# Patient Record
Sex: Female | Born: 1950 | Race: White | Hispanic: No | Smoking: Never smoker
Health system: Southern US, Community
[De-identification: ages and names within clinical notes are randomized; demographics above are authoritative.]

---

## 1998-02-18 ENCOUNTER — Other Ambulatory Visit: Admission: RE | Admit: 1998-02-18 | Discharge: 1998-02-18 | Payer: Self-pay | Admitting: Obstetrics and Gynecology

## 1998-04-29 ENCOUNTER — Inpatient Hospital Stay (HOSPITAL_COMMUNITY): Admission: RE | Admit: 1998-04-29 | Discharge: 1998-05-01 | Payer: Self-pay | Admitting: Obstetrics and Gynecology

## 1999-11-05 ENCOUNTER — Other Ambulatory Visit: Admission: RE | Admit: 1999-11-05 | Discharge: 1999-11-05 | Payer: Self-pay | Admitting: Obstetrics and Gynecology

## 2000-11-10 ENCOUNTER — Other Ambulatory Visit: Admission: RE | Admit: 2000-11-10 | Discharge: 2000-11-10 | Payer: Self-pay | Admitting: Obstetrics and Gynecology

## 2001-11-20 ENCOUNTER — Other Ambulatory Visit: Admission: RE | Admit: 2001-11-20 | Discharge: 2001-11-20 | Payer: Self-pay | Admitting: Obstetrics and Gynecology

## 2002-11-21 ENCOUNTER — Other Ambulatory Visit: Admission: RE | Admit: 2002-11-21 | Discharge: 2002-11-21 | Payer: Self-pay | Admitting: Obstetrics and Gynecology

## 2010-05-27 ENCOUNTER — Ambulatory Visit (HOSPITAL_BASED_OUTPATIENT_CLINIC_OR_DEPARTMENT_OTHER): Admission: RE | Admit: 2010-05-27 | Discharge: 2010-05-27 | Payer: Self-pay | Admitting: Orthopedic Surgery

## 2010-12-13 ENCOUNTER — Encounter: Payer: Self-pay | Admitting: Orthopedic Surgery

## 2011-02-07 LAB — POCT I-STAT 4, (NA,K, GLUC, HGB,HCT)
Glucose, Bld: 100 mg/dL — ABNORMAL HIGH (ref 70–99)
Sodium: 141 mEq/L (ref 135–145)

## 2012-01-26 ENCOUNTER — Other Ambulatory Visit: Payer: Self-pay | Admitting: Orthopedic Surgery

## 2012-01-26 DIAGNOSIS — M5126 Other intervertebral disc displacement, lumbar region: Secondary | ICD-10-CM

## 2012-02-04 ENCOUNTER — Ambulatory Visit
Admission: RE | Admit: 2012-02-04 | Discharge: 2012-02-04 | Disposition: A | Discharge status: Home / Self Care | Payer: BC Managed Care – PPO | Source: Ambulatory Visit | Attending: Orthopedic Surgery | Admitting: Orthopedic Surgery

## 2012-02-04 DIAGNOSIS — M5126 Other intervertebral disc displacement, lumbar region: Secondary | ICD-10-CM

## 2012-02-04 MED ORDER — HYDROCODONE-ACETAMINOPHEN 5-325 MG PO TABS
1.0000 | ORAL_TABLET | Freq: Once | ORAL | Status: AC
  Administered 2012-02-04: 1 via ORAL

## 2012-02-04 MED ORDER — IOHEXOL 180 MG/ML  SOLN
15.0000 mL | Freq: Once | INTRAMUSCULAR | Status: AC | PRN
  Administered 2012-02-04: 15 mL via INTRATHECAL

## 2012-02-04 NOTE — Discharge Instructions (Signed)
Myelogram Discharge Instructions  1. Go home and rest quietly for the next 24 hours.  It is important to lie flat for the next 24 hours.  Get up only to go to the restroom.  You may lie in the bed or on a couch on your back, your stomach, your left side or your right side.  You may have one pillow under your head.  You may have pillows between your knees while you are on your side or under your knees while you are on your back.  2. DO NOT drive today.  Recline the seat as far back as it will go, while still wearing your seat belt, on the way home.  3. You may get up to go to the bathroom as needed.  You may sit up for 10 minutes to eat.  You may resume your normal diet and medications unless otherwise indicated.  Drink lots of extra fluids today and tomorrow.  4. The incidence of headache, nausea, or vomiting is about 5% (one in 20 patients).  If you develop a headache, lie flat and drink plenty of fluids until the headache goes away.  Caffeinated beverages may be helpful.  If you develop severe nausea and vomiting or a headache that does not go away with flat bed rest, call [phone].  5. You may resume normal activities after your 24 hours of bed rest is over; however, do not exert yourself strongly or do any heavy lifting tomorrow.  6. Call your physician for a follow-up appointment.  The results of your myelogram will be sent directly to your physician by the following day.  7. If you have any questions or if complications develop after you arrive home, please call [phone].  Discharge instructions have been explained to the patient.  The patient, or the person responsible for the patient, fully understands these instructions.       May resume citalopram on February 05, 2012, after 9:30 am.

## 2013-06-28 IMAGING — CT CT L SPINE W/ CM
3 of 10 series · 9 of 27 positions shown, 10 images · IV contrast (omnipaque)
Comparison: MRI of the lumbar spine at [REDACTED] 12/01/2011.

CLINICAL DATA: Low back pain extending into the right lower
extremity.  Lumbar disc displacement without myelopathy.  722.10.

MYELOGRAM INJECTION
TECHNIQUE: Informed consent was obtained from the patient prior to
the procedure, including potential complications of headache,
allergy, infection and pain.  A timeout procedure was performed.
With the patient prone, the lower back was prepped with Betadine.
1% Lidocaine was used for local anesthesia.  Lumbar puncture was
performed at the left paramidline L2-3 level using a 22 gauge
needle with return of clear CSF.  15 ml of Omnipaque 433was
injected into the subarachnoid space .
TECHNIQUE: Following injection of intrathecal Omnipaque contrast,
spine imaging in multiple projections was performed using
fluoroscopy.
Fluoroscopy Time: 1.19 minutes.
TECHNIQUE: CT imaging of the lumbar spine was performed after
intrathecal contrast administration.  Multiplanar CT image
reconstructions were also generated.

[Series 2: l spine bone · axial · 0.27mm/px · z∈[+8,+86]mm · 2 of 94 slices shown, 3 images]
[im 32/94  soft-tissue]
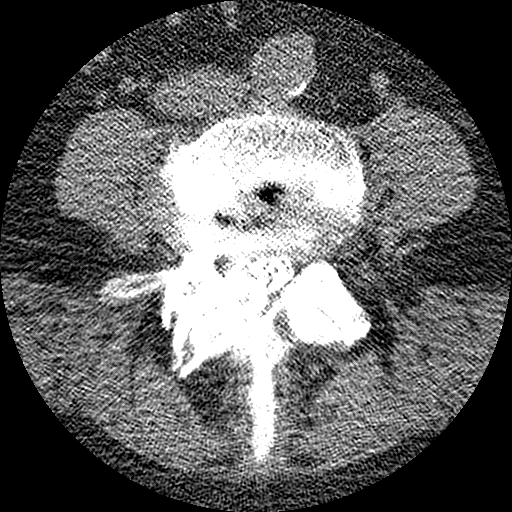
[im 32/94  bone]
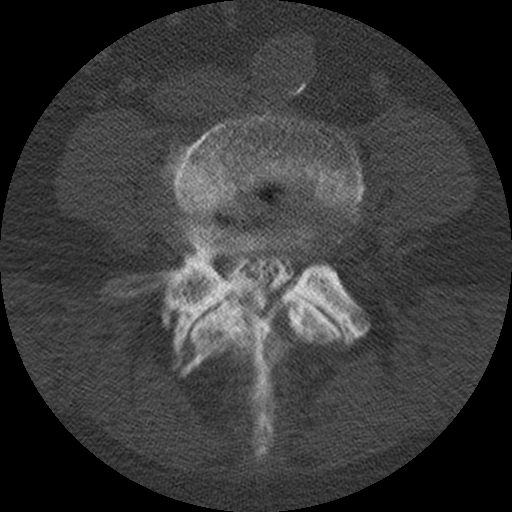
[im 63/94  bone]
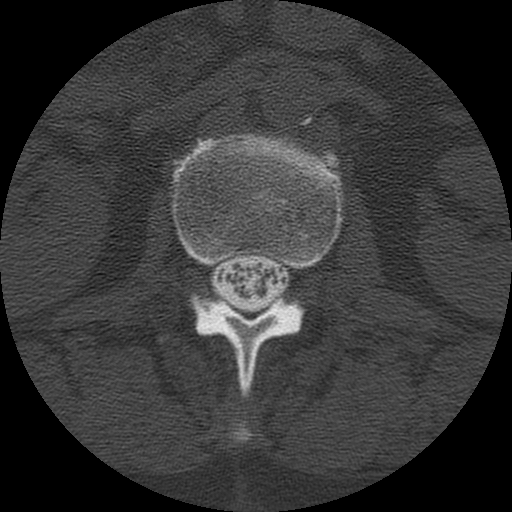

[Series 3: l spine soft · axial · 0.27mm/px · z∈[+8,+86]mm · 2 of 94 slices shown]
[im 32/94  soft-tissue]
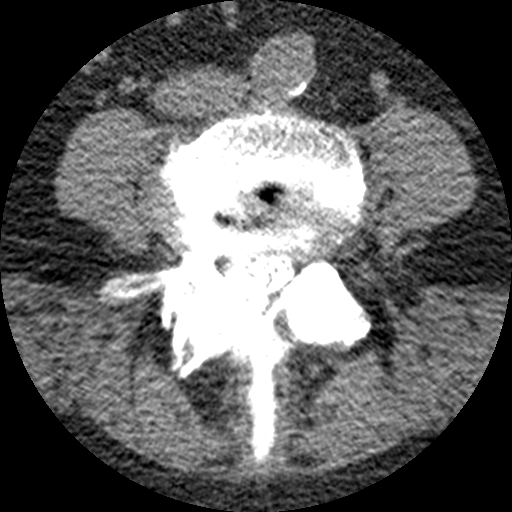
[im 63/94  soft-tissue]
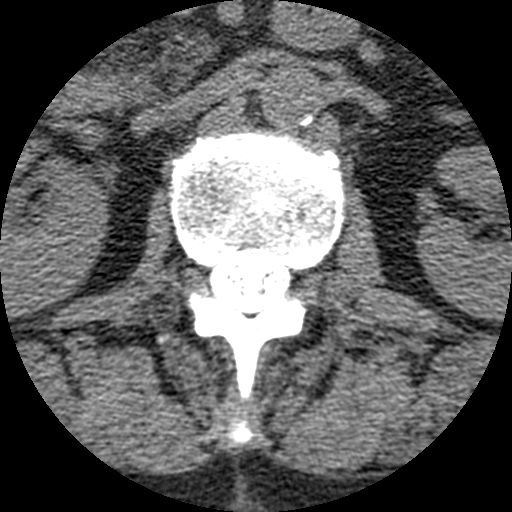

[Series 400: cor · coronal · 0.47mm/px · 5 of 55 slices shown]
[im 10/55  bone]
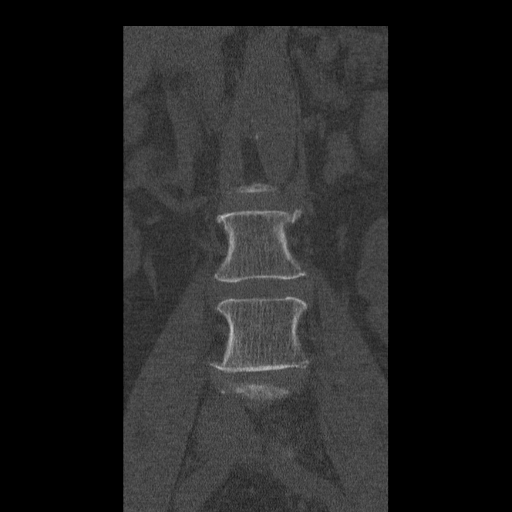
[im 19/55  bone]
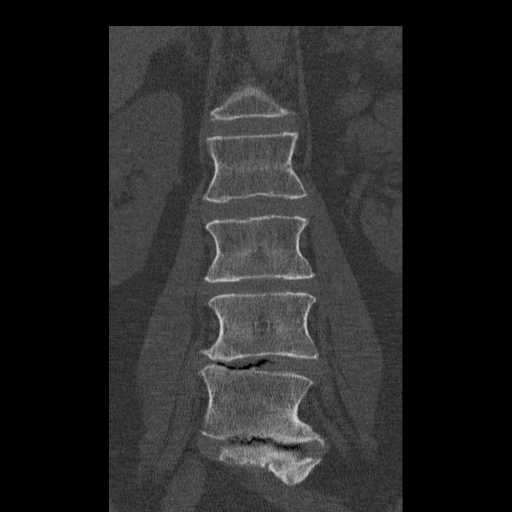
[im 28/55  bone]
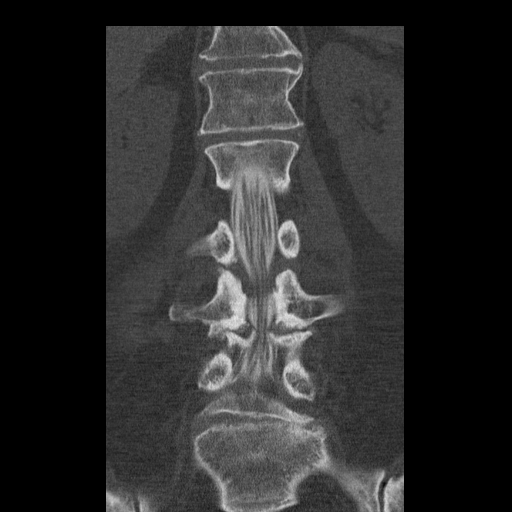
[im 37/55  bone]
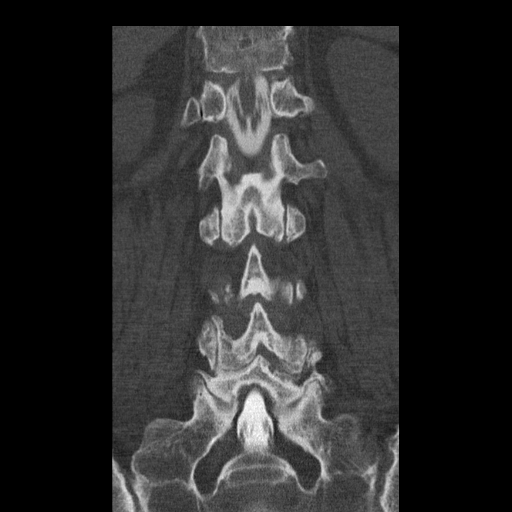
[im 46/55  bone]
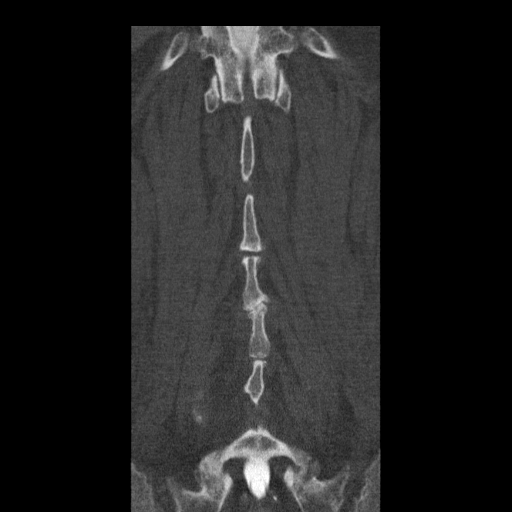

[9 of 27 positions shown; findings below may reference images not displayed]

IMPRESSION: Successful injection of  intrathecal contrast for myelography.

MYELOGRAM LUMBAR
FINDINGS: A transitional S1 segment is evident.  The L1 segment
demonstrates a small left-sided rib.  Further correlation for
numbering could be done with a chest radiograph.  For the purposes
of this exam, the last fully formed vertebral body will be labeled
S1.

The upper nerve roots fill normally.  A waist deformity is present
at L3-4 in association with a prominent broad-based disc
herniation.  There is impact on the lateral recess bilaterally,
right greater than left.

There is slight lateral listhesis at L4-5 to the left.  Right
greater than left lateral recess narrowing is secondary to
uncovering of the disc and a rightward disc protrusion.

Disc herniation and lateral recess narrowing is worse on the left
at L5-S1.

The upright images demonstrate slight exaggeration of the L3-4 disc
herniation.  The alignment, including slight anterolisthesis at L4-
5 is stable in the neutral upright position.  There is slight
exaggeration of the anterolisthesis at L4-5 with flexion.  This is
reduced in extension.
IMPRESSION: 1.  Mild dynamic anterolisthesis at L4-5.
2.  Broad-based disc herniation at L3-4 with mild lateral recess
narrowing bilaterally.  The disc herniation is exaggerated upon
standing.
3.  Right greater than left lateral recess narrowing at L4-5.
4.  Mild left lateral recess narrowing at L5-S1.
5.  Transitional anatomy at S1.


CT MYELOGRAPHY LUMBAR SPINE
FINDINGS: Accounting for transitional anatomy at S1, the conus
medullaris terminates at L1-2, within normal limits.  The
anterolisthesis at L4-5 measures 3 mm.  Alignment is otherwise
anatomic.  Vacuum phenomena are evident in the disc spaces at L3-4,
L4-5, and L5-S1.  The

Minimal atherosclerotic calcifications are present in the aorta.
There is no aneurysm.  The the individual disc levels are as
follows.

T12-L1:  A shallow rightward disc protrusion is present.  This
extends into the inferior recess of the right neural foramen
without significant stenosis.

L1-2:  No significant disc herniation or stenosis is present.

L2-3:  No significant disc herniation or stenosis is present.
There is mild facet disease.

L3-4:  A broad-based disc herniation is present.  Mild lateral
recess narrowing is evident bilaterally.  Facet hypertrophy and
ligamentum flavum thickening contribute.  Mild foraminal narrowing
is similar to the prior exam bilaterally.

L4-5:  Slight anterolisthesis results in uncovering of the disc.  A
broad-based disc herniation is present.  Advanced facet hypertrophy
is worse on the right.  Mild lateral recess narrowing is worse on
the right.  There is focal levo convex curvature of lumbar spine at
L4-5. Moderate foraminal stenosis bilaterally is secondary to
exposed disc but primarily due to facet hypertrophy.

L5-S1:  A broad-based disc herniation is present.  Advanced facet
hypertrophy is evident bilaterally.  Mild left lateral recess
narrowing is present.  Moderate to severe left and moderate right
foraminal stenosis is secondary to facet disease.
IMPRESSION: 1.  Broad-based disc herniation and mild facet hypertrophy at L3-4
results in mild lateral recess narrowing bilaterally.  Mild
foraminal narrowing is stable.
2.  Advanced facet disease and anterolisthesis at L4-5.
3.  Mild right lateral recess narrowing at L4-5.
4.  Moderate to severe foraminal stenosis bilaterally at L4-5 is
worse on the right.  This is predominately due to facet disease.
5.  Mild left lateral recess narrowing at L5-S1.
6.  Moderate to severe left and moderate right foraminal stenosis
at L5-S1.  This is again due to facet disease.

## 2018-09-21 ENCOUNTER — Ambulatory Visit (INDEPENDENT_AMBULATORY_CARE_PROVIDER_SITE_OTHER): Payer: BC Managed Care – PPO | Admitting: Rehabilitative and Restorative Service Providers"

## 2018-09-21 ENCOUNTER — Encounter: Payer: Self-pay | Admitting: Rehabilitative and Restorative Service Providers"

## 2018-09-21 DIAGNOSIS — R293 Abnormal posture: Secondary | ICD-10-CM

## 2018-09-21 DIAGNOSIS — R29898 Other symptoms and signs involving the musculoskeletal system: Secondary | ICD-10-CM | POA: Diagnosis not present

## 2018-09-21 DIAGNOSIS — M545 Low back pain: Secondary | ICD-10-CM | POA: Diagnosis not present

## 2018-09-21 DIAGNOSIS — G8929 Other chronic pain: Secondary | ICD-10-CM

## 2018-09-21 DIAGNOSIS — M542 Cervicalgia: Secondary | ICD-10-CM

## 2018-09-21 NOTE — Patient Instructions (Signed)
Axial Extension (Chin Tuck)    Pull chin in and lengthen back of neck. Hold __5__ seconds while counting out loud. Repeat __10__ times. Do __several__ sessions per day.  Shoulder Blade Squeeze    Rotate shoulders back, then squeeze shoulder blades down and back Hold 10 sec Repeat __10__ times. Do _several__ sessions per day.  Upper Back Strength: Lower Trapezius / Rotator Cuff " L's "     Arms in waitress pose, palms up. Press hands back and slide shoulder blades down. Hold for __5__ seconds. Repeat _10___ times. 1-2 times per day.    Scapular Retraction: Elbow Flexion (Standing)  "W's"     With elbows bent to 90, pinch shoulder blades together and rotate arms out, keeping elbows bent. Repeat __10__ times per set. Do __1-2__ sets per session. Do _several ___ sessions per day.  Self massage using a 4 inch plastic ball    TENS UNIT: This is helpful for muscle pain and spasm.   Search and Purchase a TENS 7000 2nd edition at www.tenspros.com. It should be less than $30.     TENS unit instructions: Do not shower or bathe with the unit on Turn the unit off before removing electrodes or batteries If the electrodes lose stickiness add a drop of water to the electrodes after they are disconnected from the unit and place on plastic sheet. If you continued to have difficulty, call the TENS unit company to purchase more electrodes. Do not apply lotion on the skin area prior to use. Make sure the skin is clean and dry as this will help prolong the life of the electrodes. After use, always check skin for unusual red areas, rash or other skin difficulties. If there are any skin problems, does not apply electrodes to the same area. Never remove the electrodes from the unit by pulling the wires. Do not use the TENS unit or electrodes other than as directed. Do not change electrode placement without consultating your therapist or physician. Keep 2 fingers with between each  electrode.     [facility] [address] [place], [place] [address]  [phone] (office) [phone] (fax)

## 2018-09-21 NOTE — Therapy (Signed)
[facility] [place] [address] [place], [place], [address] Phone: [phone]   Fax:  [phone]  Physical Therapy Evaluation  Patient Details  Name: Chelsea Macdonald MRN: [id number] Date of Birth: 12-05-1950 Referring Provider (PT): Dr Sheran Luz   Encounter Date: 09/21/2018  PT End of Session - 09/21/18 1653    Visit Number  1    Number of Visits  12    Date for PT Re-Evaluation  11/02/18    PT Start Time  1653    PT Stop Time  1751    PT Time Calculation (min)  58 min    Activity Tolerance  Patient tolerated treatment well       History reviewed. No pertinent past medical history.  History reviewed. No pertinent surgical history.  There were no vitals filed for this visit.   Subjective Assessment - 09/21/18 1702    Subjective  Patient reports that she was involved in [facility] 1/18 with injury to neck at that time. She was taking medication for LBP and did not notice so much pain in the neck. She has noticed increasing pain in the neck in the past year.  she now has pain in the neck all the time. She has some temporary relief with medications but pain in present on a daily basis. She has had LBP for several years. She was seen for LBP 11/2009. She was diagnosed with disc problems L3/4/5. She was working in the water with improvement but has not been able to exercise in the water in over a year.     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  Yes    Pain Score  6     Pain Location  Neck    Pain Orientation  Posterior;Lower;Mid;Right;Left    Pain Descriptors / Indicators  Nagging    Pain Type  Chronic pain     Pain Radiating Towards  once into the shoulder area normally in the neck only     Pain Onset  More than a month ago    Pain Frequency  Constant    Aggravating Factors   when tired; not sure what irritates symptoms     Pain Relieving Factors  meds; ice; pain patches     Multiple Pain Sites  Yes    Pain Score  6    Pain Location  Back    Pain Orientation  Lower;Right;Left;Mid    Pain Descriptors / Indicators  Aching    Pain Type  Chronic pain    Pain Radiating Towards  occasionally pain down the side of the Rt leg     Pain Onset  More than a month ago    Pain Frequency  Constant    Aggravating Factors   standing; driving     Pain Relieving Factors  ice; water exercise; medication          [facility] PT Assessment - 09/21/18 0001      Assessment   Medical Diagnosis  Cervicalgia; LBP     Referring Provider (PT)  Dr Sheran Luz  Onset Date/Surgical Date  11/22/16   LBP present ~ 8-9 yrs    Hand Dominance  Right    Next MD Visit  12/19    Prior Therapy  some PT in past LBP not tolerated well - PT for knee       Precautions   Precautions  None      Balance Screen   Has the patient fallen in the past 6 months  Yes    How many times?  1    Has the patient had a decrease in activity level because of a fear of falling?   No    Is the patient reluctant to leave their home because of a fear of falling?   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       Observation/Other Assessments   Focus on Therapeutic Outcomes (FOTO)   54% limitation       Sensation   Additional Comments  WFL's per pt report        Posture/Postural Control   Posture Comments  head forward; shoulders rounded and elevated; scapulae abducted and rotated along the thoracic wall       AROM   Cervical Flexion  52    Cervical Extension  40    Cervical - Right Side Bend  30    Cervical - Left Side Bend  30    Cervical - Right Rotation  54    Cervical - Left Rotation  55      Palpation   Spinal mobility  hypomobility through the lower cervical and upper thoracic spine with CPA and lateral mobs     Palpation comment  muscular tightnes noted through the ant/lat/post cervical musculature; upper trap; leveator; leveator; pecs                 Objective measurements completed on examination: See above findings.      [facility] Adult PT Treatment/Exercise - 09/21/18 0001      Neuro Re-ed    Neuro Re-ed Details   initiated work on posture and alignment working on chest lift and engaging posterior shoulder girdle       Shoulder Exercises: Standing   Other Standing Exercises  axial extensin 10 sec x 5; scap squeeze 10 sec x 5; L's x 5; W's x 5 with swim noodle       Moist Heat Therapy   Number Minutes Moist Heat  12 Minutes    Moist Heat Location  Cervical;Lumbar Spine      Electrical Stimulation   Electrical Stimulation Location  bilat lower cervical/upper thoracic spine     Electrical Stimulation Action  IFC    Electrical Stimulation Parameters  to tolerance     Electrical Stimulation Goals  Pain;Tone             PT Education - 09/21/18 1738    Education Details  HEP TENS     Person(s) Educated  Patient    Methods  Explanation;Demonstration;Tactile cues;Verbal  cues;Handout    Comprehension  Verbalized understanding;Returned demonstration;Verbal cues required;Tactile cues required       PT Short Term Goals - 09/21/18 1750      PT SHORT TERM GOAL #1   Title  complete assessment for lumbar spine 10/06/18    Time  1    Period  Weeks    Status  New        PT Long Term Goals - 09/21/18 1751       PT LONG TERM GOAL #1   Title  Improve posture and alignment with patient to demonstrate improved posture and alignment with posterior shoulder girdle engaged 11/02/18    Time  6    Period  Weeks    Status  New      PT LONG TERM GOAL #2   Title  increase cervical ROM/mobility with minimal to no pain 11/02/18    Time  6    Period  Weeks    Status  New      PT LONG TERM GOAL #3   Title  decrease cervical and LBP with patient to report 2/10 to 3/10 pain for functional activity level     Time  6    Period  Weeks    Status  New      PT LONG TERM GOAL #4   Title  independent in HEP 11/02/18    Time  6    Period  Weeks    Status  New      PT LONG TERM GOAL #5   Title  improve FOTO to </= 47% limitation 11/02/18    Time  6    Period  Weeks    Status  New             Plan - 09/21/18 1745    Clinical Impression Statement  Patient presents with c/o chronic cervical and lumbar pain with poor posture and alignment; limited mobilty/ROM; postural weakness; muscular tightness to palpation; pai nlimiting functional activitiy level. Patient will benefit from PT to address problems identified.     History and Personal Factors relevant to plan of care:  chronic cervical and LBP; forward posture with work and home activities; sedentary lifestyle; recliner    Clinical Presentation  Stable    Clinical Decision Making  Low    Rehab Potential  Good    PT Frequency  2x / week    PT Duration  6 weeks    PT Treatment/Interventions  Patient/family education;ADLs/Self Care Home Management;Cryotherapy;Electrical Stimulation;Iontophoresis 4mg /ml Dexamethasone;Moist Heat;Ultrasound;Dry needling;Manual techniques;Neuromuscular re-education;Therapeutic activities;Therapeutic exercise    PT Next Visit Plan  review HEP; evaluation of LBP; postural correction; stretching; postural strengthening; modalities as indicated     Consulted and Agree with Plan of Care  Patient       Patient will benefit from  skilled therapeutic intervention in order to improve the following deficits and impairments:  Postural dysfunction, Improper body mechanics, Pain, Increased fascial restricitons, Increased muscle spasms, Hypomobility, Decreased mobility, Decreased range of motion, Decreased strength, Decreased activity tolerance  Visit Diagnosis: Cervicalgia - Plan: PT plan of care cert/re-cert  Chronic bilateral low back pain without sciatica - Plan: PT plan of care cert/re-cert  Abnormal posture - Plan: PT plan of care cert/re-cert  Other symptoms and signs involving the musculoskeletal system - Plan: PT plan of care cert/re-cert     Problem List There are no active problems to display for this patient.   Ithzel Fedorchak Rober Minion PT, MPH  09/21/2018, 6:05  PM  [facility] [address] [place], [place], [address] Phone: [phone]   Fax:  [phone]  Name: Chelsea Macdonald MRN: [id number] Date of Birth: 15-Nov-1951

## 2018-09-27 ENCOUNTER — Ambulatory Visit: Payer: BC Managed Care – PPO | Admitting: Rehabilitative and Restorative Service Providers"

## 2018-09-27 ENCOUNTER — Encounter: Payer: Self-pay | Admitting: Rehabilitative and Restorative Service Providers"

## 2018-09-27 DIAGNOSIS — R29898 Other symptoms and signs involving the musculoskeletal system: Secondary | ICD-10-CM | POA: Diagnosis not present

## 2018-09-27 DIAGNOSIS — M542 Cervicalgia: Secondary | ICD-10-CM | POA: Diagnosis not present

## 2018-09-27 DIAGNOSIS — M545 Low back pain: Secondary | ICD-10-CM

## 2018-09-27 DIAGNOSIS — G8929 Other chronic pain: Secondary | ICD-10-CM

## 2018-09-27 DIAGNOSIS — R293 Abnormal posture: Secondary | ICD-10-CM | POA: Diagnosis not present

## 2018-09-27 NOTE — Therapy (Signed)
[facility] [place] [address] [place], [place], [address] Phone: [phone]   Fax:  [phone]  Physical Therapy Treatment  Patient Details  Name: Chelsea Macdonald MRN: [id number] Date of Birth: 1951-07-07 Referring Provider (PT): Dr Sheran Luz   Encounter Date: 09/27/2018  PT End of Session - 09/27/18 1604    Visit Number  2    Number of Visits  12    Date for PT Re-Evaluation  11/02/18    PT Start Time  1603    PT Stop Time  1659    PT Time Calculation (min)  56 min    Activity Tolerance  Patient tolerated treatment well       History reviewed. No pertinent past medical history.  History reviewed. No pertinent surgical history.  There were no vitals filed for this visit.  Subjective Assessment - 09/27/18 1608    Subjective  Patient reports that she had some relief with the initial treatment in the neck. She has done her exercises at home.     Currently in Pain?  Yes    Pain Score  4     Pain Location  Neck    Pain Orientation  Lower;Mid;Right;Left    Pain Descriptors / Indicators  Nagging    Pain Type  Chronic pain    Pain Onset  More than a month ago    Pain Frequency  Intermittent    Pain Score  6    Pain Location  Back    Pain Orientation  Lower;Right;Left;Mid    Pain Descriptors / Indicators  Aching    Pain Type  Chronic pain                       [facility] Adult PT Treatment/Exercise - 09/27/18 0001      Shoulder Exercises: Standing   Other Standing Exercises  axial extensin 10 sec x 5; scap squeeze 10 sec x 5; L's x 5; W's x 5 with swim noodle       Shoulder Exercises: ROM/Strengthening   Nustep  L5 x 6 min UE at 10       Shoulder Exercises: Stretch   Other Shoulder Stretches  3 way doorway stretch 30 sec x 2 each position as tolerated with Rt shoulder pathology    3 positions lower w/highest position ~ 90 deg abd      Moist Heat Therapy   Number Minutes Moist Heat  20 Minutes     Moist Heat Location  Cervical;Lumbar Spine      Electrical Stimulation   Electrical Stimulation Location  bilat lower cervical/upper thoracic spine     Electrical Stimulation Action  IFC    Electrical Stimulation Parameters  to tolerance    Electrical Stimulation Goals  Pain;Tone      Manual Therapy   Manual therapy comments  pt sitting    Joint Mobilization  lateral thoracic mobs Grade !!     Soft tissue mobilization  deep tissue work through the cervical and upper thoracic musculature - ant/lat/posterior     Passive ROM  assisted with axial extension in sitting - no pain - 10 sec x 3 reps; gentle lateral flexion from axial extension position 5 sec hold x 3 reps Rt/Lt              PT Education - 09/27/18 1651    Education Details  HEP     Person(s) Educated  Patient    Methods  Explanation;Demonstration;Tactile cues;Verbal cues;Handout    Comprehension  Verbalized understanding;Returned demonstration;Verbal cues required;Tactile cues required       PT Short Term Goals - 09/21/18 1750      PT SHORT TERM GOAL #1   Title  complete assessment for lumbar spine 10/06/18    Time  1    Period  Weeks    Status  New        PT Long Term Goals - 09/21/18 1751      PT LONG TERM GOAL #1   Title  Improve posture and alignment with patient to demonstrate improved posture and alignment with posterior shoulder girdle engaged 11/02/18    Time  6    Period  Weeks    Status  New      PT LONG TERM GOAL #2   Title  increase cervical ROM/mobility with minimal to no pain 11/02/18    Time  6    Period  Weeks    Status  New      PT LONG TERM GOAL #3   Title  decrease cervical and LBP with patient to report 2/10 to 3/10 pain for functional activity level     Time  6    Period  Weeks    Status  New      PT LONG TERM GOAL #4   Title  independent in HEP 11/02/18    Time  6    Period  Weeks    Status  New      PT LONG TERM GOAL #5   Title  improve FOTO to </= 47% limitation  11/02/18    Time  6    Period  Weeks    Status  New            Plan - 09/27/18 1604    Clinical Impression Statement  Patient reports that she has had continued pain in the cervical area. She has been working on her HEP. Patient has forward head posture with fulcrum of forward movement in the lower cervical/upper thoracic spine in the area of pt's tightness and pain. She added exercise with caution for pec stretch due to shoulder problems. She tolerated manual work well and demonstrated improved cervical mobility following manual work.     Rehab Potential  Good    PT Frequency  2x / week    PT Duration  6 weeks    PT Treatment/Interventions  Patient/family education;ADLs/Self Care Home Management;Cryotherapy;Electrical Stimulation;Iontophoresis 4mg /ml Dexamethasone;Moist Heat;Ultrasound;Dry needling;Manual techniques;Neuromuscular re-education;Therapeutic activities;Therapeutic exercise    PT Next Visit Plan  review HEP; evaluation of LBP; postural correction; stretching; postural strengthening; modalities as indicated     Consulted and Agree with Plan of Care  Patient       Patient will benefit from skilled therapeutic intervention in order to improve the following deficits and impairments:  Postural dysfunction, Improper body mechanics, Pain, Increased fascial restricitons, Increased muscle spasms, Hypomobility, Decreased mobility, Decreased range of motion, Decreased strength, Decreased activity tolerance  Visit Diagnosis: Cervicalgia  Chronic bilateral low back pain without sciatica  Abnormal posture  Other symptoms and signs involving the musculoskeletal system     Problem List There are no active problems to display for this patient.   Celyn Rober Minion PT, MPH  09/27/2018, 4:54 PM  [facility] [address] [place], [place], [address] Phone: [phone]   Fax:  [phone]  Name: Chelsea Macdonald MRN:  [id number] Date of Birth: 1950-12-07

## 2018-09-27 NOTE — Patient Instructions (Addendum)
Scapula Adduction With Pectoralis Stretch: Low - Standing   Shoulders at 45 hands even with shoulders, keeping weight through legs, shift weight forward until you feel pull or stretch through the front of your chest. Hold _30__ seconds. Do _3__ times, _2-4__ times per day.   Scapula Adduction With Pectoralis Stretch: Mid-Range - Standing   Shoulders at 90 elbows even with shoulders, keeping weight through legs, shift weight forward until you feel pull or strength through the front of your chest. Hold __30_ seconds. Do _3__ times, __2-4_ times per day.   Scapula Adduction With Pectoralis Stretch: High - Standing   Shoulders at 120 hands up high on the doorway, keeping weight on feet, shift weight forward until you feel pull or stretch through the front of your chest. Hold _30__ seconds. Do _3__ times, _2-3__ times per day.   Flexors, Sitting / Standing    Stand or sit, head in comfortable, centered position. Draw chin in, pulling head straight back, keeping jaw and eyes level. Hold _5-10 __ seconds. Repeat __3-5_ times per session. Do _several__ sessions per day.   Side Bend, Sitting tuck chin back first!!    Sit, head in comfortable, centered position, chin slightly tucked. Gently tilt head, bringing ear toward same-side shoulder. Hold __5-10 _ seconds.  Repeat _3-5__ times per session. Do _3-4_ sessions per day.

## 2018-10-02 ENCOUNTER — Ambulatory Visit: Payer: BC Managed Care – PPO | Admitting: Physical Therapy

## 2018-10-02 ENCOUNTER — Encounter: Payer: Self-pay | Admitting: Physical Therapy

## 2018-10-02 DIAGNOSIS — R29898 Other symptoms and signs involving the musculoskeletal system: Secondary | ICD-10-CM | POA: Diagnosis not present

## 2018-10-02 DIAGNOSIS — M545 Low back pain, unspecified: Secondary | ICD-10-CM

## 2018-10-02 DIAGNOSIS — R293 Abnormal posture: Secondary | ICD-10-CM | POA: Diagnosis not present

## 2018-10-02 DIAGNOSIS — G8929 Other chronic pain: Secondary | ICD-10-CM

## 2018-10-02 DIAGNOSIS — M542 Cervicalgia: Secondary | ICD-10-CM

## 2018-10-02 NOTE — Therapy (Signed)
[facility] [place] [address] [place], [place], [address] Phone: [phone]   Fax:  [phone]  Physical Therapy Treatment  Patient Details  Name: Chelsea Macdonald MRN: [id number] Date of Birth: [birth year] Referring Provider (PT): Dr Sheran Luz   Encounter Date: 10/02/2018  PT End of Session - 10/02/18 1607    Visit Number  3    Number of Visits  12    Date for PT Re-Evaluation  11/02/18    PT Start Time  1604    PT Stop Time  1657    PT Time Calculation (min)  53 min    Activity Tolerance  Patient tolerated treatment well       History reviewed. No pertinent past medical history.  History reviewed. No pertinent surgical history.  There were no vitals filed for this visit.  Subjective Assessment - 10/02/18 1607    Subjective  "I'm no longer having constant pain (in neck)".  As long as she continues her exercises, her neck is feeling better.      Patient Stated Goals  to see what she can do to decrease pain in the neck     Currently in Pain?  Yes    Pain Score  6     Pain Location  Knee    Pain Orientation  Right    Pain Descriptors / Indicators  Aching    Pain Score  3    Pain Location  Neck    Pain Orientation  Right;Left    Aggravating Factors   leaning over too much    Pain Relieving Factors  exercises          [facility] PT Assessment - 10/02/18 0001      Assessment   Medical Diagnosis  Cervicalgia; LBP     Referring Provider (PT)  Dr Sheran Luz    Onset Date/Surgical Date  11/22/16   LBP present ~ 8-9 yrs    Hand Dominance  Right    Next MD Visit  12/19    Prior Therapy  some PT in past LBP not tolerated well - PT for knee       AROM   Cervical Flexion  62    Cervical Extension  40    Cervical - Right Side Bend  37    Cervical - Left Side Bend  34    Cervical - Right Rotation  60    Cervical - Left Rotation  55       [facility] Adult PT Treatment/Exercise - 10/02/18 0001      Exercises   Exercises  Shoulder;Lumbar      Lumbar Exercises: Stretches   Passive Hamstring Stretch  Right;Left;2 reps;30 seconds   supine with strap     Lumbar Exercises: Aerobic   Nustep  L5: (arms/legs) x 7 min    PTA present to discuss progress and monitor.      Shoulder Exercises: Supine   Other Supine Exercises  snow angels to tolerance x 8 reps     Other Supine Exercises  scap retraction x 5 sec x 10 reps      Shoulder Exercises: Seated   Other Seated Exercises  thoracic ext over back of chair with head supported x 5 sec, x 3 reps       Shoulder Exercises: Standing   Other Standing Exercises  L's x 5 sec hold x 10 reps; W's x 5 sec hold x 10 reps  Shoulder Exercises: Stretch   Other Shoulder Stretches   position doorway stretch x 20 sec x 2 reps, mid level with elbow stretch x 20 sec x 2 reps each,  High position with elbow straight, LUE x 2      Moist Heat Therapy   Number Minutes Moist Heat  12 Minutes   pt in supine with legs supported   Moist Heat Location  Cervical;Lumbar Spine      Electrical Stimulation   Electrical Stimulation Location  bilat lower cervical/upper thoracic spine     Electrical Stimulation Action  IFC    Electrical Stimulation Parameters  to tolerance    Electrical Stimulation Goals  Pain               PT Short Term Goals - 09/21/18 1750      PT SHORT TERM GOAL #1   Title  complete assessment for lumbar spine 10/06/18    Time  1    Period  Weeks    Status  New        PT Long Term Goals - 09/21/18 1751      PT LONG TERM GOAL #1   Title  Improve posture and alignment with patient to demonstrate improved posture and alignment with posterior shoulder girdle engaged 11/02/18    Time  6    Period  Weeks    Status  New      PT LONG TERM GOAL #2   Title  increase cervical ROM/mobility with minimal to no pain 11/02/18    Time  6    Period  Weeks    Status  New      PT LONG TERM GOAL #3   Title  decrease cervical and LBP with patient  to report 2/10 to 3/10 pain for functional activity level     Time  6    Period  Weeks    Status  New      PT LONG TERM GOAL #4   Title  independent in HEP 11/02/18    Time  6    Period  Weeks    Status  New      PT LONG TERM GOAL #5   Title  improve FOTO to </= 47% limitation 11/02/18    Time  6    Period  Weeks    Status  New            Plan - 10/02/18 1647    Clinical Impression Statement  Pt demonstrated improved cervical ROM in flexion and Rt lateral flex/ rotation.  Pt tolerated pec stretch in high and mid level positions with straight elbow.  Pt reported improved mobility of Rt knee after use of NuStep last visit.  Progressing well towards goals.     Rehab Potential  Good    PT Frequency  2x / week    PT Duration  6 weeks    PT Treatment/Interventions  Patient/family education;ADLs/Self Care Home Management;Cryotherapy;Electrical Stimulation;Iontophoresis 4mg /ml Dexamethasone;Moist Heat;Ultrasound;Dry needling;Manual techniques;Neuromuscular re-education;Therapeutic activities;Therapeutic exercise    PT Next Visit Plan  evaluation of LBP; continue postural correction.      Consulted and Agree with Plan of Care  Patient       Patient will benefit from skilled therapeutic intervention in order to improve the following deficits and impairments:  Postural dysfunction, Improper body mechanics, Pain, Increased fascial restricitons, Increased muscle spasms, Hypomobility, Decreased mobility, Decreased range of motion, Decreased strength, Decreased activity tolerance  Visit Diagnosis: Cervicalgia  Chronic bilateral  low back pain without sciatica  Abnormal posture  Other symptoms and signs involving the musculoskeletal system     Problem List There are no active problems to display for this patient.  Mayer Camel, PTA 10/02/18 4:52 PM  [facility] [place] [address] [place], [place], [address] Phone:  [phone]   Fax:  [phone]  Name: EVAH RASHID MRN: [id number] Date of Birth: March 18, 1951

## 2018-10-05 ENCOUNTER — Ambulatory Visit: Payer: BC Managed Care – PPO | Admitting: Rehabilitative and Restorative Service Providers"

## 2018-10-05 ENCOUNTER — Encounter: Payer: Self-pay | Admitting: Rehabilitative and Restorative Service Providers"

## 2018-10-05 DIAGNOSIS — G8929 Other chronic pain: Secondary | ICD-10-CM

## 2018-10-05 DIAGNOSIS — R293 Abnormal posture: Secondary | ICD-10-CM | POA: Diagnosis not present

## 2018-10-05 DIAGNOSIS — M545 Low back pain: Secondary | ICD-10-CM

## 2018-10-05 DIAGNOSIS — R29898 Other symptoms and signs involving the musculoskeletal system: Secondary | ICD-10-CM | POA: Diagnosis not present

## 2018-10-05 DIAGNOSIS — M542 Cervicalgia: Secondary | ICD-10-CM

## 2018-10-05 NOTE — Patient Instructions (Signed)
Abdominal Bracing With Pelvic Floor (Hook-Lying)    With neutral spine, tighten pelvic floor and abdominals sucking belly button to back bone; tense muscles in the low back at waist; exhale slowly. Hold 10 sec  Repeat __10_ times. Do _several __ times a day. Progress to do this in sitting, standing, walking and with functional activities.    HIP: Hamstrings - Supine  Place strap around foot. Raise leg up, keeping knee straight.  Bend opposite knee to protect back if indicated. Hold 30 seconds. 3 reps per set, 2-3 sets per day  Outer Hip Stretch: Reclined IT Band Stretch (Strap)   Strap around one foot, pull leg across body until you feel a pull or stretch in the outside of your hip, with shoulders on mat. Hold for 30 seconds. Repeat 3 times each leg. 2-3 times/day.  Piriformis Stretch   Lying on back, pull right knee toward opposite shoulder. Hold 30 seconds. Repeat 3 times. Do 2-3 sessions per day.   Quads / HF, Prone KNEE: Quadriceps - Prone    Place strap around ankle. Bring ankle toward buttocks. Press hip into surface. Hold 30 seconds. Repeat 3 times per session. Do 2-3 sessions per day.   Trunk: Prone Extension (Press-Ups)    Lie on stomach on firm, flat surface. Relax bottom and legs. Raise chest in air with elbows straight. Keep hips flat on surface, sag stomach. Hold __1-2__ seconds. Repeat _5-10___ times. Do _1-2___ sessions per day. CAUTION: Movement should be gentle and slow.

## 2018-10-05 NOTE — Therapy (Signed)
[facility] [address] [place], [place], [address] Phone: [phone]   Fax:  [phone]  Physical Therapy Treatment  Patient Details  Name: Chelsea Dominoatricia D Rettinger MRN: [id number] Date of Birth: 05/07/1951 Referring Provider (PT): Dr Sheran Luzichard Ramos   Encounter Date: 10/05/2018  PT End of Session - 10/05/18 1617    Visit Number  4    Number of Visits  12    Date for PT Re-Evaluation  11/02/18    PT Start Time  1608    PT Stop Time  1657    PT Time Calculation (min)  49 min    Activity Tolerance  Patient tolerated treatment well       History reviewed. No pertinent past medical history.  History reviewed. No pertinent surgical history.  There were no vitals filed for this visit.  Subjective Assessment - 10/05/18 1618    Subjective  Neck is feeling better - much better when she remembers to sit up straight. LBP and Rt knee continue. She has done some exercises for her knee which seem to help.     Currently in Pain?  Yes    Pain Score  5     Pain Location  Back    Pain Orientation  Right;Left    Pain Descriptors / Indicators  Aching    Pain Radiating Towards  into the hip and Rt knee     Pain Onset  More than a month ago    Pain Score  2    Pain Location  Neck    Pain Orientation  Left;Right    Pain Descriptors / Indicators  Aching    Pain Radiating Towards  across back of neck and shoulders          [facility] PT Assessment - 10/05/18 0001      Assessment   Medical Diagnosis  Cervicalgia; LBP     Referring Provider (PT)  Dr Sheran Luzichard Ramos    Onset Date/Surgical Date  11/22/16   LBP present ~ 8-9 yrs    Hand Dominance  Right    Next MD Visit  12/19    Prior Therapy  some PT in past LBP not tolerated well - PT for knee       AROM   Right/Left Hip  --   limited hip ext bilat    Right Knee Extension  -10    Right Knee Flexion  109    Lumbar Flexion  80%    Lumbar Extension  10% tight     Lumbar - Right  Side Bend  55%    Lumbar - Left Side Bend  55%    Lumbar - Right Rotation  60%    Lumbar - Left Rotation  60%      Strength   Right Hip Flexion  4+/5    Right Hip Extension  4/5    Right Hip ABduction  4/5    Right Hip ADduction  4+/5    Left Hip Flexion  4+/5    Left Hip Extension  4/5    Left Hip ABduction  4/5    Left Hip ADduction  4+/5    Right Knee Flexion  4+/5    Right Knee Extension  4+/5    Left Knee Flexion  5/5    Left Knee Extension  5/5      Flexibility   Hamstrings  tight Rt > Lt - knees slightly flexed with HS stretch    Quadriceps  tight prone knee flexion 78 deg; Lt 90 deg    ITB  tight Rt > Lt    Piriformis  tight Rt > Lt       Palpation   Spinal mobility  hypomobile through the lumbar spine with CPA spring testing     Palpation comment  muscular tightness through Rt > Lt lumbar spine musculature; piriformis; hip abductors/gluts - discomfort medial Rt knee through the joint line and surrounding area                    [facility] Adult PT Treatment/Exercise - 10/05/18 0001      Lumbar Exercises: Stretches   Passive Hamstring Stretch  Right;2 reps;30 seconds   30 sec hold    Prone on Elbows Stretch  1 rep;60 seconds    Press Ups  --   1-2 sec x 5    Quad Stretch  Right;3 reps;30 seconds   prone with strap    ITB Stretch  Right;2 reps;30 seconds   supine with strap    Piriformis Stretch  Right;3 reps;30 seconds   supine travelll      Lumbar Exercises: Supine   Other Supine Lumbar Exercises  4 part core 10 sec x 10 reps - requires verbal and tactile cues       Shoulder Exercises: ROM/Strengthening   Nustep  L5 x 8 min UE at 10       Moist Heat Therapy   Number Minutes Moist Heat  10 Minutes    Moist Heat Location  Cervical;Lumbar Spine      Electrical Stimulation   Electrical Stimulation Location  bilat lumbar    Electrical Stimulation Action  IFC    Electrical Stimulation Parameters  to tolerance    Electrical Stimulation Goals   Pain;Tone             PT Education - 10/05/18 1653    Education Details  HEP     Person(s) Educated  Patient    Methods  Explanation;Demonstration;Tactile cues;Verbal cues;Handout    Comprehension  Verbalized understanding;Returned demonstration;Verbal cues required;Tactile cues required       PT Short Term Goals - 10/05/18 1709      PT SHORT TERM GOAL #1   Title  complete assessment for lumbar spine 10/06/18    Time  1    Period  Weeks    Status  Achieved        PT Long Term Goals - 10/05/18 1709      PT LONG TERM GOAL #1   Title  Improve posture and alignment with patient to demonstrate improved posture and alignment with posterior shoulder girdle engaged 11/02/18    Time  6    Period  Weeks    Status  On-going      PT LONG TERM GOAL #2   Title  increase cervical ROM/mobility with minimal to no pain 11/02/18    Time  6    Period  Weeks    Status  On-going      PT LONG TERM GOAL #3   Title  decrease cervical and LBP with patient to report 2/10 to 3/10 pain for functional activity level     Time  6    Period  Weeks    Status  On-going      PT LONG TERM GOAL #4   Title  independent in HEP 11/02/18    Time  6    Period  Weeks    Status  On-going      PT LONG TERM GOAL #5   Title  improve FOTO to </= 47% limitation 11/02/18    Time  6    Period  Weeks    Status  On-going      PT LONG TERM GOAL #6   Title  Increase strength LE's to 5-/5 to 5/5 bilat 11/02/18    Time  4    Period  Weeks    Status  New            Plan - 10/05/18 1617    Clinical Impression Statement  Patient has chronic LBP and Rt knee pain. She has decreased lumbar and Rt knee ROM and strength. Patient has muscular tightness to palpation through Rt > Lt lumbar paraspinals/QL/piriformis/gluts. She has poor core stability and strength and decreased functional activity level related to LBP and Rt knee pain. Patient responded well to exercise tolerating all exercises with decrease in  symptoms with modalities at the end of the treatment.     Rehab Potential  Good    PT Frequency  2x / week    PT Duration  6 weeks    PT Treatment/Interventions  Patient/family education;ADLs/Self Care Home Management;Cryotherapy;Electrical Stimulation;Iontophoresis 4mg /ml Dexamethasone;Moist Heat;Ultrasound;Dry needling;Manual techniques;Neuromuscular re-education;Therapeutic activities;Therapeutic exercise    PT Next Visit Plan  stretching/strengthening/core stabilization for LBP; possible trial of taping/iontophoresis for Rt knee; continue postural correction.      Consulted and Agree with Plan of Care  Patient       Patient will benefit from skilled therapeutic intervention in order to improve the following deficits and impairments:  Postural dysfunction, Improper body mechanics, Pain, Increased fascial restricitons, Increased muscle spasms, Hypomobility, Decreased mobility, Decreased range of motion, Decreased strength, Decreased activity tolerance  Visit Diagnosis: Cervicalgia  Chronic bilateral low back pain without sciatica  Abnormal posture  Other symptoms and signs involving the musculoskeletal system     Problem List There are no active problems to display for this patient.   Aroura Vasudevan Rober Minion PT, MPH  10/05/2018, 5:13 PM  [facility] [address] [place], [place], [address] Phone: [phone]   Fax:  [phone]  Name: TRU LEOPARD MRN: [id number] Date of Birth: [birth year]

## 2018-10-13 ENCOUNTER — Encounter: Payer: Self-pay | Admitting: Physical Therapy

## 2018-10-13 ENCOUNTER — Ambulatory Visit: Payer: BC Managed Care – PPO | Admitting: Physical Therapy

## 2018-10-13 DIAGNOSIS — M545 Low back pain, unspecified: Secondary | ICD-10-CM

## 2018-10-13 DIAGNOSIS — R29898 Other symptoms and signs involving the musculoskeletal system: Secondary | ICD-10-CM

## 2018-10-13 DIAGNOSIS — R293 Abnormal posture: Secondary | ICD-10-CM | POA: Diagnosis not present

## 2018-10-13 DIAGNOSIS — M542 Cervicalgia: Secondary | ICD-10-CM

## 2018-10-13 DIAGNOSIS — G8929 Other chronic pain: Secondary | ICD-10-CM

## 2018-10-13 NOTE — Therapy (Signed)
[facility] [address] [place], [place], [address] Phone: [phone]   Fax:  [phone]  Physical Therapy Treatment  Patient Details  Name: Chelsea Macdonald MRN: [id number] Date of Birth: 1951-02-22 Referring Provider (PT): Dr Sheran Luzichard Ramos   Encounter Date: 10/13/2018  PT End of Session - 10/13/18 1536    Visit Number  5    Number of Visits  12    Date for PT Re-Evaluation  11/02/18    PT Start Time  1532    PT Stop Time  1625    PT Time Calculation (min)  53 min    Activity Tolerance  Patient tolerated treatment well    Behavior During Therapy  [facility] for tasks assessed/performed       History reviewed. No pertinent past medical history.  History reviewed. No pertinent surgical history.  There were no vitals filed for this visit.  Subjective Assessment - 10/13/18 1540    Subjective  Pt reports she is not sure what she did, but her back has had intense pain (up to 8/10).  Today is better, but still very sore.  She took ibprofen for pain, and this helped.  She brought her TENS unit for instruction on use.     Patient Stated Goals  to see what she can do to decrease pain in the neck     Currently in Pain?  Yes    Pain Score  4     Pain Location  Back    Pain Orientation  Right;Left;Lower;Mid    Pain Descriptors / Indicators  Aching    Aggravating Factors   bending over too much     Pain Relieving Factors  medication, ice, heat.          [facility] - 10/13/18 0001      Assessment   Medical Diagnosis  Cervicalgia; LBP     Referring Provider (PT)  Dr Sheran Luzichard Ramos    Onset Date/Surgical Date  11/22/16   LBP present ~ 8-9 yrs    Hand Dominance  Right    Next MD Visit  12/19    Prior Therapy  some PT in past LBP not tolerated well - PT for knee        [facility] Treatment/Exercise - 10/13/18 0001      Self-Care   Self-Care  Other Self-Care Comments    Other Self-Care Comments   Pt  educated on safety, parameters, and application of TENS unit; pt verbalized understanding.       Lumbar Exercises: Stretches   Passive Hamstring Stretch  Right;Left;2 reps;30 seconds    Prone on Elbows Stretch  5 reps;10 seconds    Quad Stretch  Right;30 seconds;Left;2 reps   prone with strap    ITB Stretch  Right;Left;1 rep;30 seconds   cues on form   Piriformis Stretch  Right;Left;2 reps;30 seconds      Lumbar Exercises: Aerobic   Nustep  --   PTA present to discuss progress and monitor.      Lumbar Exercises: Supine   Other Supine Lumbar Exercises  TA engagement with lap press x 5 sec x 5 reps      Shoulder Exercises: ROM/Strengthening   Nustep  L4 x 8 min UE at 10    PTA present to discuss progress     Moist Heat Therapy   Number Minutes Moist Heat  10 Minutes    Moist Heat Location  Lumbar Spine  Programme researcher, broadcasting/film/video Location  upper and lower lumbar     Electrical Stimulation Action  TENS     Electrical Stimulation Parameters  to tolerance    Electrical Stimulation Goals  Pain;Tone             PT Education - 10/13/18 1625    Education Details  TENS application, safety and parameters.     Person(s) Educated  Patient    Methods  Explanation    Comprehension  Verbalized understanding       PT Short Term Goals - 10/05/18 1709      PT SHORT TERM GOAL #1   Title  complete assessment for lumbar spine 10/06/18    Time  1    Period  Weeks    Status  Achieved        PT Long Term Goals - 10/05/18 1709      PT LONG TERM GOAL #1   Title  Improve posture and alignment with patient to demonstrate improved posture and alignment with posterior shoulder girdle engaged 11/02/18    Time  6    Period  Weeks    Status  On-going      PT LONG TERM GOAL #2   Title  increase cervical ROM/mobility with minimal to no pain 11/02/18    Time  6    Period  Weeks    Status  On-going      PT LONG TERM GOAL #3   Title  decrease cervical and  LBP with patient to report 2/10 to 3/10 pain for functional activity level     Time  6    Period  Weeks    Status  On-going      PT LONG TERM GOAL #4   Title  independent in HEP 11/02/18    Time  6    Period  Weeks    Status  On-going      PT LONG TERM GOAL #5   Title  improve FOTO to </= 47% limitation 11/02/18    Time  6    Period  Weeks    Status  On-going      PT LONG TERM GOAL #6   Title  Increase strength LE's to 5-/5 to 5/5 bilat 11/02/18    Time  4    Period  Weeks    Status  New            Plan - 10/13/18 1616    Clinical Impression Statement  Pt required minor cues on correcting form of exercises issued last visit.  She tolerated all exercises well, without increase in symptoms.  Pt verbalized understanding of application of TENS and returned demo.  Progressing towards goals.     Rehab Potential  Good    PT Frequency  2x / week    PT Duration  6 weeks    PT Treatment/Interventions  Patient/family education;ADLs/Self Care Home Management;Cryotherapy;Electrical Stimulation;Iontophoresis 4mg /ml Dexamethasone;Moist Heat;Ultrasound;Dry needling;Manual techniques;Neuromuscular re-education;Therapeutic activities;Therapeutic exercise    PT Next Visit Plan  Continue progressive stretching/strengthening/core stabilization for LBP; continue postural correction.      Consulted and Agree with Plan of Care  Patient       Patient will benefit from skilled therapeutic intervention in order to improve the following deficits and impairments:  Postural dysfunction, Improper body mechanics, Pain, Increased fascial restricitons, Increased muscle spasms, Hypomobility, Decreased mobility, Decreased range of motion, Decreased strength, Decreased activity tolerance  Visit Diagnosis: Chronic bilateral low back pain  without sciatica  Cervicalgia  Abnormal posture  Other symptoms and signs involving the musculoskeletal system     Problem List There are no active problems to  display for this patient.  Mayer Camel, PTA 10/13/18 4:38 PM  [facility] [place] [address] [place], [place], [address] Phone: [phone]   Fax:  [phone]  Name: Chelsea Macdonald MRN: [id number] Date of Birth: 02-Jun-1951

## 2018-10-18 ENCOUNTER — Encounter: Payer: Self-pay | Admitting: Rehabilitative and Restorative Service Providers"

## 2018-10-25 ENCOUNTER — Encounter: Payer: Self-pay | Admitting: Physical Therapy

## 2018-10-25 ENCOUNTER — Ambulatory Visit: Payer: BC Managed Care – PPO | Admitting: Physical Therapy

## 2018-10-25 DIAGNOSIS — M545 Low back pain: Secondary | ICD-10-CM

## 2018-10-25 DIAGNOSIS — R29898 Other symptoms and signs involving the musculoskeletal system: Secondary | ICD-10-CM

## 2018-10-25 DIAGNOSIS — R293 Abnormal posture: Secondary | ICD-10-CM

## 2018-10-25 DIAGNOSIS — G8929 Other chronic pain: Secondary | ICD-10-CM | POA: Diagnosis not present

## 2018-10-25 NOTE — Patient Instructions (Signed)
Calf Stretch    Place hands on wall at shoulder height. Keeping back leg straight, bend front leg, feet pointing forward, heels flat on floor. Lean forward slightly until stretch is felt in calf of back leg. Hold stretch _15-20__ seconds, breathing slowly in and out. Repeat stretch with other leg back. 2 reps each leg.  Do _1-2__ sessions per day.  Therapeutic - Bridging    Lift buttocks, keeping back straight and arms on floor.  Can put feet on ball or couch.  Hold __2__ seconds. Repeat _10___ times.  (Home) Extension: Thoracic With Lumbar Lock - Sitting    Sit with back against chair, knees bent, hands locked behind head. Breathe in, extending head and trunk over chair back. Breathe out. Hold position for __2-3__ breaths. Repeat __3__ times per set.   [facility] [address] [place], [place] [address]  [phone]) [phone] (fax)

## 2018-10-25 NOTE — Therapy (Addendum)
[facility] [address] [place], [place], [address] Phone: [phone]   Fax:  [phone]  Physical Therapy Treatment  Patient Details  Name: Chelsea Macdonald MRN: [id number] Date of Birth: Apr 08, 1951 Referring Provider (PT): Dr Sheran Luzichard Ramos   Encounter Date: 10/25/2018  PT End of Session - 10/25/18 1604    Visit Number  6    Number of Visits  12    Date for PT Re-Evaluation  11/02/18    PT Start Time  1605    PT Stop Time  1658    PT Time Calculation (min)  53 min    Activity Tolerance  Patient tolerated treatment well    Behavior During Therapy  [facility] for tasks assessed/performed       History reviewed. No pertinent past medical history.  History reviewed. No pertinent surgical history.  There were no vitals filed for this visit.  Subjective Assessment - 10/25/18 1610    Subjective  Pt reports she fell at school a week ago; fell on her Lt knee and Lt hand. She noticed her Rt foot turned out and rolled right over.  Her Rt knee continues to hurt, as well as her back.  She has been using ice/ heat. Hasn't used TENS unit much. Pt reported significant reduction in pain at end of session.     Patient Stated Goals  to see what she can do to decrease pain in the neck     Currently in Pain?  Yes    Pain Score  6     Pain Location  Back    Pain Orientation  Lower;Right;Left    Pain Descriptors / Indicators  Aching;Dull    Aggravating Factors   bending over too much    Pain Relieving Factors  ice, medication     Pain Score  7    Pain Location  Knee    Pain Orientation  Right    Aggravating Factors   bending too much; prolonged standing     Pain Relieving Factors  ice, rest.          [facility] PT Assessment - 10/25/18 0001      Assessment   Medical Diagnosis  Cervicalgia; LBP     Referring Provider (PT)  Dr Sheran Luzichard Ramos    Onset Date/Surgical Date  11/22/16   LBP present ~ 8-9 yrs    Hand Dominance  Right     Next MD Visit  12/19    Prior Therapy  some PT in past LBP not tolerated well - PT for knee        [facility] - 10/25/18 0001      Exercises   Exercises  Lumbar      Lumbar Exercises: Stretches   Lower Trunk Rotation  4 reps;10 seconds    Standing Extension  2 reps;30 seconds    Piriformis Stretch  Right;Left;2 reps;30 seconds    Piriformis Stretch Limitations  trial in sitting; unable to complete wiht RLE, moved to supine     Gastroc Stretch  5 reps;Right;Left;20 seconds    Gastroc Stretch Limitations  heel off step and runners stretch position      Lumbar Exercises: Seated   Other Seated Lumbar Exercises  seated marching with TA engaged x 10       Lumbar Exercises: Supine   Ab Set  10 reps;5 seconds    AB Set Limitations  cues for engagement.     Bent Knee  Raise  10 reps    Bent Knee Raise Limitations  some cramping in LLE      Shoulder Exercises: Seated   Other Seated Exercises  thoracic ext over back of chair with head supported x 5 sec, x 4 reps; W's x 5 reps        Shoulder Exercises: ROM/Strengthening   Nustep  --   held due to recent fall     Modalities   Modalities  Cryotherapy;Electrical Stimulation;Moist Heat      Moist Heat Therapy   Number Minutes Moist Heat  12 Minutes    Moist Heat Location  Lumbar Spine      Cryotherapy   Number Minutes Cryotherapy  12 Minutes    Cryotherapy Location  Knee   Rt   Type of Cryotherapy  Ice pack      Electrical Stimulation   Electrical Stimulation Location  upper and lower lumbar     Electrical Stimulation Action  IFC    Electrical Stimulation Parameters  to tolerance    Electrical Stimulation Goals  Pain;Tone             PT Education - 10/25/18 1650    Education Details  HEP    Person(s) Educated  Patient    Methods  Explanation;Handout;Verbal cues;Demonstration    Comprehension  Verbalized understanding;Verbal cues required;Returned demonstration       PT Short Term Goals -  10/05/18 1709      PT SHORT TERM GOAL #1   Title  complete assessment for lumbar spine 10/06/18    Time  1    Period  Weeks    Status  Achieved        PT Long Term Goals - 10/05/18 1709      PT LONG TERM GOAL #1   Title  Improve posture and alignment with patient to demonstrate improved posture and alignment with posterior shoulder girdle engaged 11/02/18    Time  6    Period  Weeks    Status  On-going      PT LONG TERM GOAL #2   Title  increase cervical ROM/mobility with minimal to no pain 11/02/18    Time  6    Period  Weeks    Status  On-going      PT LONG TERM GOAL #3   Title  decrease cervical and LBP with patient to report 2/10 to 3/10 pain for functional activity level     Time  6    Period  Weeks    Status  On-going      PT LONG TERM GOAL #4   Title  independent in HEP 11/02/18    Time  6    Period  Weeks    Status  On-going      PT LONG TERM GOAL #5   Title  improve FOTO to </= 47% limitation 11/02/18    Time  6    Period  Weeks    Status  On-going      PT LONG TERM GOAL #6   Title  Increase strength LE's to 5-/5 to 5/5 bilat 11/02/18    Time  4    Period  Weeks    Status  New            Plan - 10/25/18 1654    Clinical Impression Statement  Pt no longer reporting neck pain, however recent fall has increased her LBP and knee pain.  She had some cramping in LLE with  bridging, but tolerated all other exercises well without increased pain.  Progressing gradually towards goals.     Rehab Potential  Good    PT Frequency  2x / week    PT Duration  6 weeks    PT Treatment/Interventions  Patient/family education;ADLs/Self Care Home Management;Cryotherapy;Electrical Stimulation;Iontophoresis 4mg /ml Dexamethasone;Moist Heat;Ultrasound;Dry needling;Manual techniques;Neuromuscular re-education;Therapeutic activities;Therapeutic exercise    PT Next Visit Plan  Continue progressive stretching/strengthening/core stabilization for LBP; continue postural  correction.      Consulted and Agree with Plan of Care  Patient       Patient will benefit from skilled therapeutic intervention in order to improve the following deficits and impairments:  Postural dysfunction, Improper body mechanics, Pain, Increased fascial restricitons, Increased muscle spasms, Hypomobility, Decreased mobility, Decreased range of motion, Decreased strength, Decreased activity tolerance  Visit Diagnosis: Chronic bilateral low back pain without sciatica  Abnormal posture  Other symptoms and signs involving the musculoskeletal system     Problem List There are no active problems to display for this patient.  Mayer Camel, PTA 10/25/18 5:02 PM  [facility] [place] [address] [place], [place], [address] Phone: [phone]   Fax:  [phone]  Name: Chelsea Macdonald MRN: [id number] Date of Birth: 06/10/1951

## 2018-10-30 ENCOUNTER — Encounter: Payer: Self-pay | Admitting: Rehabilitative and Restorative Service Providers"

## 2018-11-09 ENCOUNTER — Encounter: Payer: Self-pay | Admitting: Physical Therapy

## 2018-11-20 ENCOUNTER — Ambulatory Visit: Payer: BC Managed Care – PPO | Admitting: Physical Therapy

## 2018-11-20 DIAGNOSIS — M545 Low back pain: Secondary | ICD-10-CM | POA: Diagnosis not present

## 2018-11-20 DIAGNOSIS — M542 Cervicalgia: Secondary | ICD-10-CM | POA: Diagnosis not present

## 2018-11-20 DIAGNOSIS — G8929 Other chronic pain: Secondary | ICD-10-CM

## 2018-11-20 DIAGNOSIS — R293 Abnormal posture: Secondary | ICD-10-CM

## 2018-11-20 DIAGNOSIS — R29898 Other symptoms and signs involving the musculoskeletal system: Secondary | ICD-10-CM | POA: Diagnosis not present

## 2018-11-20 NOTE — Therapy (Addendum)
[place] [address], [place], [address] Phone: [phone]   Fax:  [phone]  Physical Therapy Treatment/Re-Certification  Patient Details  Name: Chelsea Macdonald MRN: [id number] Date of Birth: 29-Dec-1950 Referring Provider (PT): Dr Suella Broad   Encounter Date: 11/20/2018  PT End of Session - 11/20/18 1025    Visit Number  7    Number of Visits  12    Date for PT Re-Evaluation  12/18/18    PT Start Time  1020    PT Stop Time  1103    PT Time Calculation (min)  43 min    Activity Tolerance  Patient tolerated treatment well    Behavior During Therapy  [facility] for tasks assessed/performed       No past medical history on file.  No past surgical history on file.  There were no vitals filed for this visit.  Subjective Assessment - 11/20/18 1025    Subjective  Pt reports she has been on prednisone for last 2 wks (to treat upper respiratory issue) so all her joints have been feeling better.  As she finished her last dose, she states she can start feeling her back pain returning.   She has been using TENS as needed and doing HEP every other day.     Patient Stated Goals  to see what she can do to decrease pain in the neck     Currently in Pain?  Yes    Pain Score  4    pain medicine prior to session.    Pain Location  Back    Pain Orientation  Right;Left;Lower    Pain Descriptors / Indicators  Aching;Sharp   sometimes sharp   Aggravating Factors   bending over too much    Pain Relieving Factors  ice, medication          [facility] PT Assessment - 11/20/18 0001      Assessment   Medical Diagnosis  Cervicalgia; LBP     Referring Provider (PT)  Dr Suella Broad    Onset Date/Surgical Date  11/22/16   LBP present ~ 8-9 yrs    Hand Dominance  Right    Next MD Visit  PRN    Prior Therapy  some PT in past LBP not tolerated well - PT for knee       Observation/Other Assessments   Focus on Therapeutic  Outcomes (FOTO)   52% limited (goal of 47% limited)      AROM   Cervical Flexion  64    Cervical Extension  41    Cervical - Right Side Bend  40    Cervical - Left Side Bend  34    Cervical - Right Rotation  70    Cervical - Left Rotation  68      Strength   Right Hip Flexion  5/5    Right Hip Extension  4+/5    Right Hip ABduction  --   5-/5   Left Hip Flexion  5/5    Left Hip Extension  5/5    Left Hip ABduction  --   5-/5       [facility] Adult PT Treatment/Exercise - 11/20/18 0001      Lumbar Exercises: Stretches   Passive Hamstring Stretch  Right;Left;2 reps;30 seconds   seated   Lower Trunk Rotation  4 reps;10 seconds   arms in Y   Quad Stretch  Right;Left;2 reps;30 seconds   seated, foot under  the stool   Gastroc Stretch  Right;Left;2 reps;20 seconds   runners stretch     Lumbar Exercises: Aerobic   Nustep  L4: arms/legs x 7.5 min    PTA present to discuss progress     Lumbar Exercises: Seated   Other Seated Lumbar Exercises  Lap press with scap retraction and core engaged x 5 sec hold x 10 reps      Lumbar Exercises: Supine   Bridge  10 reps   no cramping like last session     Lumbar Exercises: Sidelying   Hip Abduction  Right;Left;10 reps      Shoulder Exercises: Stretch   Other Shoulder Stretches  verbally reviewed doorway stretch and thoracic ext over back of chair.       Modalities   Modalities  --   pt declined.             PT Education - 11/20/18 1315    Education Details  HEP    Person(s) Educated  Patient    Methods  Explanation;Handout;Verbal cues    Comprehension  Verbalized understanding;Returned demonstration       PT Short Term Goals - 10/05/18 1709      PT SHORT TERM GOAL #1   Title  complete assessment for lumbar spine 10/06/18    Time  1    Period  Weeks    Status  Achieved        PT Long Term Goals - 11/20/18 1029      PT LONG TERM GOAL #1   Title  Improve posture and alignment with patient to demonstrate  improved posture and alignment with posterior shoulder girdle engaged     Baseline  12/30: partially met; will continue goal    Time  4    Period  Weeks    Status  Partially Met    Target Date  12/18/18      PT LONG TERM GOAL #2   Title  increase cervical ROM/mobility with minimal to no pain 11/02/18    Time  6    Period  Weeks    Status  Achieved      PT LONG TERM GOAL #3   Title  decrease cervical and LBP with patient to report 2/10 to 3/10 pain for functional activity level     Status  On-going    Target Date  12/18/18      PT LONG TERM GOAL #4   Title  independent in HEP 11/02/18    Time  4    Period  Weeks    Status  On-going    Target Date  12/18/18      PT LONG TERM GOAL #5   Title  improve FOTO to </= 47% limitation 11/02/18    Baseline  12/30: improved from 54% limited to 52% limited    Time  4    Period  Weeks    Status  On-going    Target Date  12/18/18      PT LONG TERM GOAL #6   Title  Increase strength LE's to 5-/5 to 5/5 bilat 11/02/18    Time  4    Period  Weeks    Status  Partially Met    Target Date  12/18/18            Plan - 11/20/18 1048    Clinical Impression Statement  Pt's neck ROM has improved and she is no longer reporting pain; limited lateral flexion.  Pt tolerated  all exercises well, much better than at initial sessions.  Pt has partially met LTG #1 and 6, has achieved #2. Pt will benefit from continued PT intervention to maximize functional mobility.   Plan to continue PT to work towards all unmet goals.    Rehab Potential  Good    PT Frequency  2x / week    PT Duration  4 weeks    PT Treatment/Interventions  Patient/family education;ADLs/Self Care Home Management;Cryotherapy;Electrical Stimulation;Iontophoresis 2m/ml Dexamethasone;Moist Heat;Ultrasound;Dry needling;Manual techniques;Neuromuscular re-education;Therapeutic activities;Therapeutic exercise    PT Next Visit Plan  Continue progressive stretching/strengthening/core  stabilization for LBP; continue postural correction.      Consulted and Agree with Plan of Care  Patient       Patient will benefit from skilled therapeutic intervention in order to improve the following deficits and impairments:  Postural dysfunction, Improper body mechanics, Pain, Increased fascial restricitons, Increased muscle spasms, Hypomobility, Decreased mobility, Decreased range of motion, Decreased strength, Decreased activity tolerance  Visit Diagnosis: Chronic bilateral low back pain without sciatica - Plan: PT plan of care cert/re-cert  Abnormal posture - Plan: PT plan of care cert/re-cert  Cervicalgia - Plan: PT plan of care cert/re-cert  Other symptoms and signs involving the musculoskeletal system - Plan: PT plan of care cert/re-cert     Problem List There are no active problems to display for this patient.  JKerin Perna PTA 11/20/18 1:27 PM   SLaureen Abrahams PT, DPT 11/20/18 1:28 PM     [facility] [address]Phone: [phone]  Fax:  [phone] Name: Chelsea RESSLERMRN: [id number] of Birth: 920-Feb-1952 PHYSICAL THERAPY DISCHARGE SUMMARY  Visits from Start of Care: 7  Current functional level related to goals / functional outcomes: See last progress note for discharge status    Remaining deficits: Unknown    Education / Equipment: HEP Plan: Patient agrees to discharge.  Patient goals were partially met. Patient is being discharged due to the patient's request.  ?????     Celyn P. HHelene KelpPT, MPH 11/29/18 12:29 PM

## 2018-11-29 ENCOUNTER — Encounter: Payer: BC Managed Care – PPO | Admitting: Physical Therapy

## 2023-04-17 ENCOUNTER — Ambulatory Visit
Admission: EM | Admit: 2023-04-17 | Discharge: 2023-04-17 | Disposition: A | Discharge status: Home / Self Care | Payer: BC Managed Care – PPO | Attending: Internal Medicine | Admitting: Internal Medicine

## 2023-04-17 ENCOUNTER — Encounter: Payer: Self-pay | Admitting: Emergency Medicine

## 2023-04-17 DIAGNOSIS — J209 Acute bronchitis, unspecified: Secondary | ICD-10-CM | POA: Diagnosis not present

## 2023-04-17 MED ORDER — BENZONATATE 200 MG PO CAPS: 200.0000 mg | ORAL_CAPSULE | Freq: Three times a day (TID) | ORAL | 0 refills | Status: AC

## 2023-04-17 NOTE — ED Provider Notes (Signed)
[facility]hel[facility]    CSN: [id number] Arrival date & time: 04/17/23  1113      History   Chief Complaint Chief Complaint  Patient presents with   Cough    HPI Chelsea Macdonald is a 72 y.o. female comes to the urgent care with 3-day history of cough productive of yellowish sputum, nasal congestion and low-grade fever.  Patient describes symptoms which started after she mowed her grass.  Patient was not wearing a facemask when she was mowing the grass.  Following that she complains of cough and yellowish sputum production.  Patient denies any shortness of breath or wheezing.  Temperature on arrival to the urgent care was normal.  Patient denies any shortness of breath or wheezing.  No tobacco use.  No chest pain or chest tightness.  No nausea, vomiting or diarrhea.  No sick contacts.  Patient has tried over-the-counter decongestants with no improvement in her symptoms. HPI  History reviewed. No pertinent past medical history.  There are no problems to display for this patient.   History reviewed. No pertinent surgical history.  OB History   No obstetric history on file.      Home Medications    Prior to Admission medications   Medication Sig Start Date End Date Taking? Authorizing Provider  benzonatate (TESSALON) 200 MG capsule Take 1 capsule (200 mg total) by mouth every 8 (eight) hours. 04/17/23  Yes Kareema Keitt, Britta Mccreedy, MD  citalopram (CELEXA) 20 MG tablet Take 20 mg by mouth daily.   Yes [provider]  ELIQUIS 2.5 MG TABS tablet Take 2.5 mg by mouth 2 (two) times daily. 06/05/17  Yes [provider]  hydrochlorothiazide (HYDRODIURIL) 12.5 MG tablet Take 25 mg by mouth daily.   Yes [provider]  losartan-hydrochlorothiazide (HYZAAR) 100-25 MG tablet Take 1 tablet by mouth daily. 09/12/14  Yes [provider]  omeprazole (PRILOSEC) 20 MG capsule Take 40 mg by mouth daily.   Yes [provider]  pravastatin (PRAVACHOL) 40  MG tablet Take 40 mg by mouth daily. 12/13/22  Yes [provider]  sildenafil (REVATIO) 20 MG tablet Take 20 mg by mouth 3 (three) times daily. 02/15/23  Yes [provider]    Family History History reviewed. No pertinent family history.  Social History Social History   Tobacco Use   Smoking status: Never   Smokeless tobacco: Never  Vaping Use   Vaping Use: Never used  Substance Use Topics   Alcohol use: Never   Drug use: Never     Allergies   Sulfa antibiotics   Review of Systems Review of Systems As per HPI  Physical Exam Triage Vital Signs ED Triage Vitals  Enc Vitals Group     BP 04/17/23 1127 115/67     Pulse Rate 04/17/23 1127 80     Resp 04/17/23 1127 16     Temp 04/17/23 1127 98.7 F (37.1 C)     Temp Source 04/17/23 1127 Oral     SpO2 04/17/23 1127 95 %     Weight 04/17/23 1129 220 lb (99.8 kg)     Height 04/17/23 1129 5\' 6"  (1.676 m)     Head Circumference --      Peak Flow --      Pain Score 04/17/23 1129 0     Pain Loc --      Pain Edu? --      Excl. in GC? --    No data found.  Updated  Vital Signs BP 115/67 (BP Location: Right Arm)   Pulse 80   Temp 98.7 F (37.1 C) (Oral)   Resp 16   Ht 5\' 6"  (1.676 m)   Wt 99.8 kg   SpO2 95%   BMI 35.51 kg/m   Visual Acuity Right Eye Distance:   Left Eye Distance:   Bilateral Distance:    Right Eye Near:   Left Eye Near:    Bilateral Near:     Physical Exam Vitals and nursing note reviewed.  Constitutional:      General: She is not in acute distress.    Appearance: Normal appearance. She is not ill-appearing.  HENT:     Right Ear: Tympanic membrane normal.     Left Ear: Tympanic membrane normal.  Cardiovascular:     Rate and Rhythm: Normal rate and regular rhythm.     Pulses: Normal pulses.     Heart sounds: Normal heart sounds.  Pulmonary:     Effort: Pulmonary effort is normal.     Breath sounds: Normal breath sounds.  Abdominal:     General: Bowel sounds are  normal.     Palpations: Abdomen is soft.  Neurological:     Mental Status: She is alert.      UC Treatments / Results  Labs (all labs ordered are listed, but only abnormal results are displayed) Labs Reviewed - No data to display  EKG   Radiology No results found.  Procedures Procedures (including critical care time)  Medications Ordered in UC Medications - No data to display  Initial Impression / Assessment and Plan / UC Course  I have reviewed the triage vital signs and the nursing notes.  Pertinent labs & imaging results that were available during my care of the patient were reviewed by me and considered in my medical decision making (see chart for details).     1.  Acute irritant bronchitis: Tessalon Perles as needed for cough Patient's lung exam is unremarkable Reassurance given Return precautions given Patient is advised to maintain adequate hydration. Final Clinical Impressions(s) / UC Diagnoses   Final diagnoses:  Acute bronchitis, unspecified organism     Discharge Instructions      Please maintain adequate hydration Please take medications as prescribed Your lung exam is unremarkable Return to urgent care if you have worsening symptoms.   ED Prescriptions     Medication Sig Dispense Auth. Provider   benzonatate (TESSALON) 200 MG capsule Take 1 capsule (200 mg total) by mouth every 8 (eight) hours. 30 capsule [place] Sanon, Britta Mccreedy, MD      PDMP not reviewed this encounter.   Merrilee Jansky, MD 04/17/23 1245

## 2023-04-17 NOTE — Discharge Instructions (Addendum)
Please maintain adequate hydration Please take medications as prescribed Your lung exam is unremarkable Return to urgent care if you have worsening symptoms.

## 2023-04-17 NOTE — ED Triage Notes (Signed)
Patient states that she mowed her lawn on Thursday w/o a mask.  Now she had developed a cough, congestion and low grade fever.  Patient has taken OTC decongestants.
